# Patient Record
Sex: Female | Born: 1975 | Race: Black or African American | Hispanic: No | Marital: Single | Smoking: Never smoker
Health system: Southern US, Community
[De-identification: ages and names within clinical notes are randomized; demographics above are authoritative.]

## PROBLEM LIST (undated history)

## (undated) DIAGNOSIS — L309 Dermatitis, unspecified: Secondary | ICD-10-CM

## (undated) DIAGNOSIS — M940 Chondrocostal junction syndrome [Tietze]: Secondary | ICD-10-CM

## (undated) DIAGNOSIS — J45909 Unspecified asthma, uncomplicated: Secondary | ICD-10-CM

## (undated) DIAGNOSIS — H209 Unspecified iridocyclitis: Secondary | ICD-10-CM

## (undated) DIAGNOSIS — K219 Gastro-esophageal reflux disease without esophagitis: Secondary | ICD-10-CM

## (undated) HISTORY — PX: OTHER SURGICAL HISTORY: SHX169

## (undated) HISTORY — PX: HERNIA REPAIR: SHX51

## (undated) HISTORY — DX: Unspecified iridocyclitis: H20.9

## (undated) HISTORY — DX: Unspecified asthma, uncomplicated: J45.909

## (undated) HISTORY — PX: WISDOM TOOTH EXTRACTION: SHX21

---

## 2017-04-05 ENCOUNTER — Other Ambulatory Visit: Payer: Self-pay | Admitting: Orthopedic Surgery

## 2017-04-05 DIAGNOSIS — M940 Chondrocostal junction syndrome [Tietze]: Secondary | ICD-10-CM

## 2017-04-09 ENCOUNTER — Other Ambulatory Visit: Payer: BLUE CROSS/BLUE SHIELD

## 2017-04-16 ENCOUNTER — Ambulatory Visit: Payer: Self-pay | Admitting: Cardiology

## 2017-05-02 ENCOUNTER — Ambulatory Visit
Admission: RE | Admit: 2017-05-02 | Discharge: 2017-05-02 | Disposition: A | Discharge status: Home / Self Care | Payer: BLUE CROSS/BLUE SHIELD | Source: Ambulatory Visit | Attending: Orthopedic Surgery | Admitting: Orthopedic Surgery

## 2017-05-02 DIAGNOSIS — M940 Chondrocostal junction syndrome [Tietze]: Secondary | ICD-10-CM

## 2017-05-06 NOTE — Progress Notes (Signed)
Referring-Ronald Gioffre MD Reason for referral-Chest pain  HPI: 42 yo female for evaluation of chest pain at request of Latanya Maudlin MD. Chest CT 05/02/17 showed no calcified atherosclerosis and no pericardial effusion; overall normal study.  Patient has had intermittent chest pain for 20 years.  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  Her pain will last 2 days and increase with certain movements and palpation.  In the past there has been some improvement with nonsteroidals and more recently steroids.  When she is not having these episodes she denies dyspnea, exertional chest pain, palpitations or syncope.  Cardiology now asked to evaluate.  Current Outpatient Medications  Medication Sig Dispense Refill  . Ascorbic Acid (VITAMIN C) 100 MG tablet Take 100 mg by mouth daily.    Marland Kitchen BIOTIN 5000 PO Take 1 tablet by mouth daily.    . cholecalciferol (VITAMIN D) 1000 units tablet Take 1,000 Units by mouth daily.    . diclofenac (VOLTAREN) 75 MG EC tablet Take 75 mg by mouth as directed.    . fluticasone (FLONASE) 50 MCG/ACT nasal spray Place 1 spray into both nostrils daily.    . Fluticasone-Salmeterol (ADVAIR DISKUS) 250-50 MCG/DOSE AEPB Inhale 1 puff into the lungs 2 (two) times daily.    . folic acid (FOLVITE) 1 MG tablet Take 1 mg by mouth daily.  2  . hydrocortisone 2.5 % cream Apply 1 application topically as directed.    Marland Kitchen ibuprofen (ADVIL,MOTRIN) 800 MG tablet Take 800 mg by mouth every 8 (eight) hours as needed.    . Inositol-Folic Acid (PREGNITUDE PO) Take 1 tablet by mouth daily.    . Levocetirizine Dihydrochloride (XYZAL PO) Take 1 tablet by mouth daily.    . meloxicam (MOBIC) 15 MG tablet Take 15 mg by mouth daily.    . montelukast (SINGULAIR) 10 MG tablet Take 10 mg by mouth at bedtime.    . Prenatal Vit-Fe Fumarate-FA (MULTIVITAMIN-PRENATAL) 27-0.8 MG TABS tablet Take 1 tablet by mouth daily at 12 noon.    . SUPER B COMPLEX/C PO Take 1 tablet by mouth  daily.    Marland Kitchen tiZANidine (ZANAFLEX) 4 MG tablet Take 4 mg by mouth as directed.     No current facility-administered medications for this visit.     Allergies  Allergen Reactions  . Augmentin [Amoxicillin-Pot Clavulanate]      Past Medical History:  Diagnosis Date  . Asthma   . Iritis     Past Surgical History:  Procedure Laterality Date  . HERNIA REPAIR      Social History   Socioeconomic History  . Marital status: Single    Spouse name: Not on file  . Number of children: 0  . Years of education: Not on file  . Highest education level: Not on file  Social Needs  . Financial resource strain: Not on file  . Food insecurity - worry: Not on file  . Food insecurity - inability: Not on file  . Transportation needs - medical: Not on file  . Transportation needs - non-medical: Not on file  Occupational History    Comment: Chief Operating Officer and directors  Tobacco Use  . Smoking status: Never Smoker  . Smokeless tobacco: Never Used  Substance and Sexual Activity  . Alcohol use: Yes    Comment: Occasional  . Drug use: Not on file  . Sexual activity: Not on file  Other Topics Concern  . Not on file  Social History  Narrative  . Not on file    Family History  Problem Relation Age of Onset  . Stroke Maternal Grandfather     ROS: no fevers or chills, productive cough, hemoptysis, dysphasia, odynophagia, melena, hematochezia, dysuria, hematuria, rash, seizure activity, orthopnea, PND, pedal edema, claudication. Remaining systems are negative.  Physical Exam:   Blood pressure 120/88, pulse 88, height 5' 6.5" (1.689 m), weight 268 lb (121.6 kg).  General:  Well developed/obese in NAD Skin warm/dry Patient not depressed No peripheral clubbing Back-normal HEENT-normal/normal eyelids Neck supple/normal carotid upstroke bilaterally; no bruits; no JVD; no thyromegaly chest - CTA/ normal expansion CV - RRR/normal S1 and S2; no murmurs, rubs or  gallops;  PMI nondisplaced Abdomen -NT/ND, no HSM, no mass, + bowel sounds, no bruit 2+ femoral pulses, no bruits Ext-no edema, chords, 2+ DP Neuro-grossly nonfocal  ECG -sinus rhythm at a rate of 88.  No ST changes.  Personally reviewed  A/P  1 chest pain-patient's chest pain is consistent with musculoskeletal pain.  Her electrocardiogram shows no ST changes.  Chest CT shows no coronary calcification.  We will not pursue further cardiac evaluation.  Agree with treating chest pain with nonsteroidals.  Kirk Ruths, MD

## 2017-05-08 ENCOUNTER — Telehealth: Payer: Self-pay

## 2017-05-08 NOTE — Telephone Encounter (Signed)
FAXED NOTES TO [facility] 

## 2017-05-11 ENCOUNTER — Ambulatory Visit: Payer: BLUE CROSS/BLUE SHIELD | Admitting: Cardiology

## 2017-05-11 ENCOUNTER — Encounter: Payer: Self-pay | Admitting: Cardiology

## 2017-05-11 VITALS — BP 120/88 | HR 88 | Ht 66.5 in | Wt 268.0 lb

## 2017-05-11 DIAGNOSIS — R072 Precordial pain: Secondary | ICD-10-CM

## 2017-05-11 NOTE — Addendum Note (Signed)
Addended by: Zebedee Iba on: 05/11/2017 11:23 AM   Modules accepted: Orders

## 2017-05-11 NOTE — Patient Instructions (Signed)
Medication Instructions:  Your physician recommends that you continue on your current medications as directed. Please refer to the Current Medication list given to you today.   Labwork: none  Testing/Procedures: none  Follow-Up: Follow up with Dr. Stanford Breed as needed.   Any Other Special Instructions Will Be Listed Below (If Applicable).     If you need a refill on your cardiac medications before your next appointment, please call your pharmacy.

## 2018-11-11 IMAGING — CT CT CHEST W/O CM
2 of 4 series · 14 of 36 positions shown, 17 images · non-contrast
Comparison: None.

CLINICAL DATA: 41-year-old female with costochondral region
symptoms for several years.

EXAM:
CT CHEST WITHOUT CONTRAST
TECHNIQUE: Multidetector CT imaging of the chest was performed following the
standard protocol without IV contrast.

[Series 2: chest 2.00 br40 s3 ax · axial · 0.69mm/px · z∈[+1588,+1832]mm · 11 of 146 slices shown, 14 images]
[im 12/146  mediastinal]
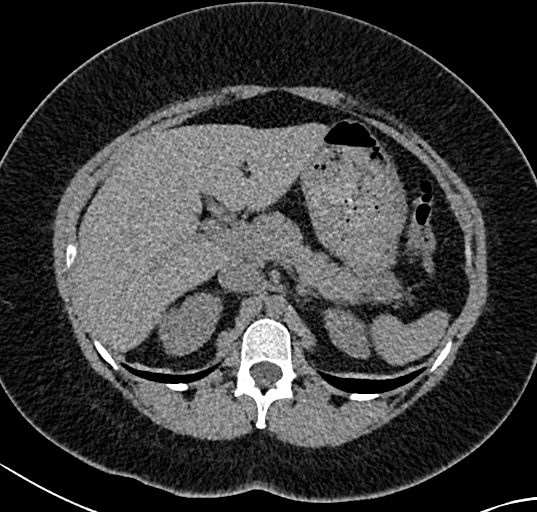
[im 12/146  lung]
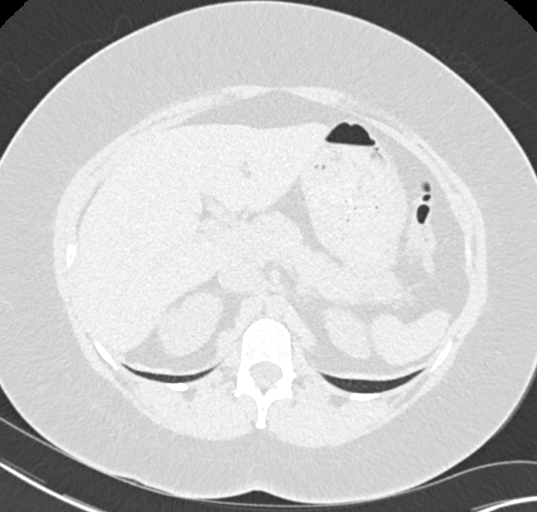
[im 23/146  lung]
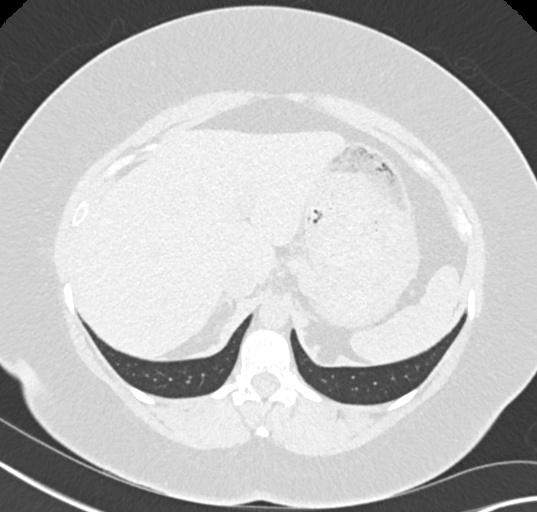
[im 34/146  lung]
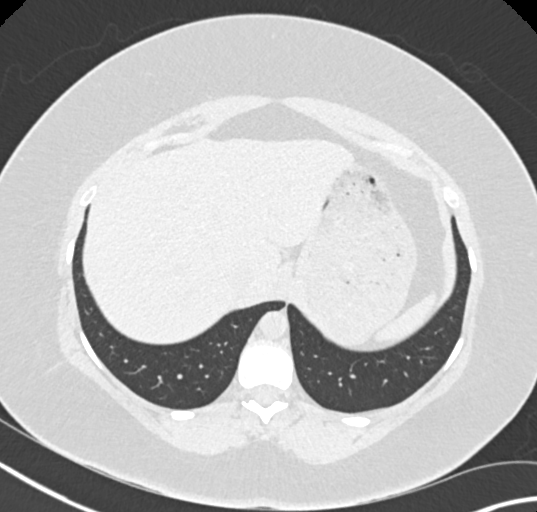
[im 45/146  lung]
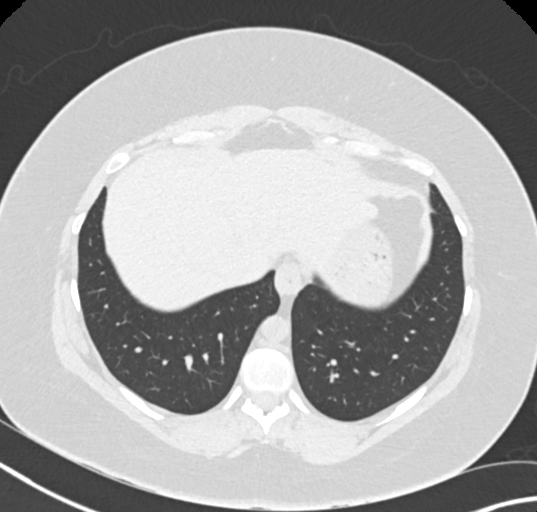
[im 56/146  mediastinal]
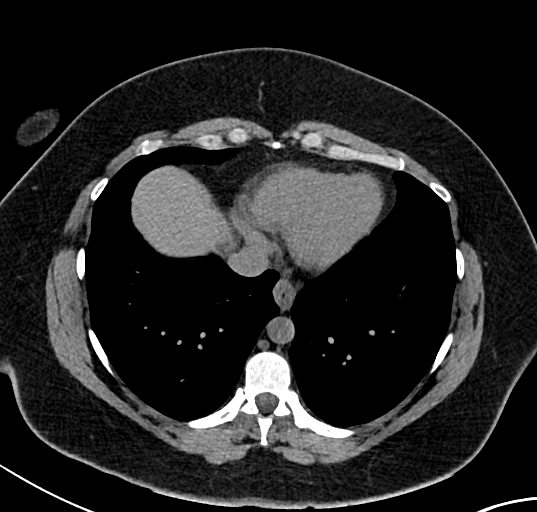
[im 56/146  lung]
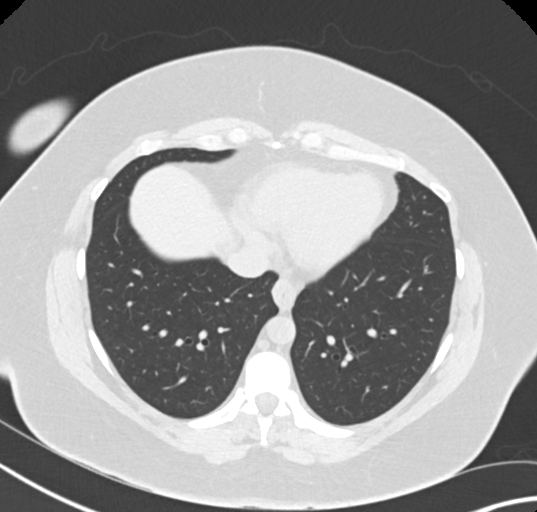
[im 79/146  lung]
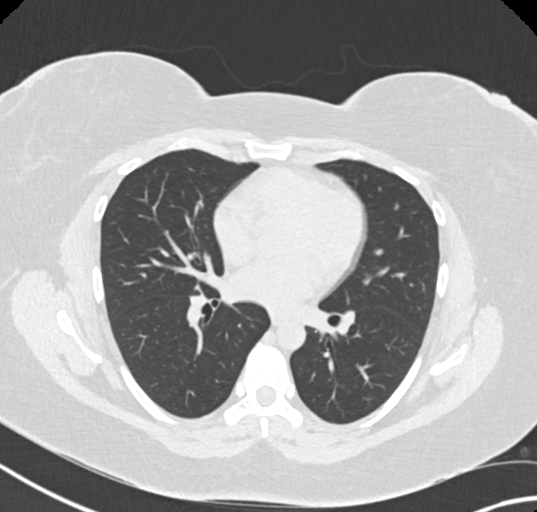
[im 90/146  lung]
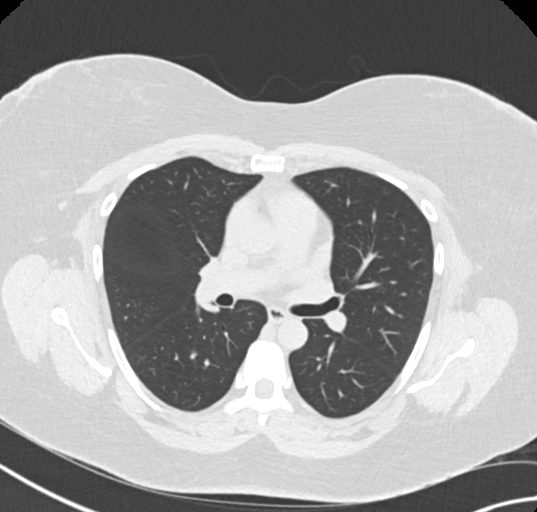
[im 101/146  lung]
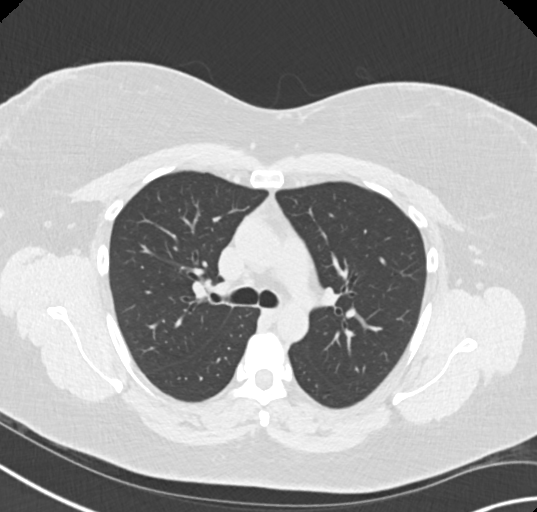
[im 112/146  mediastinal]
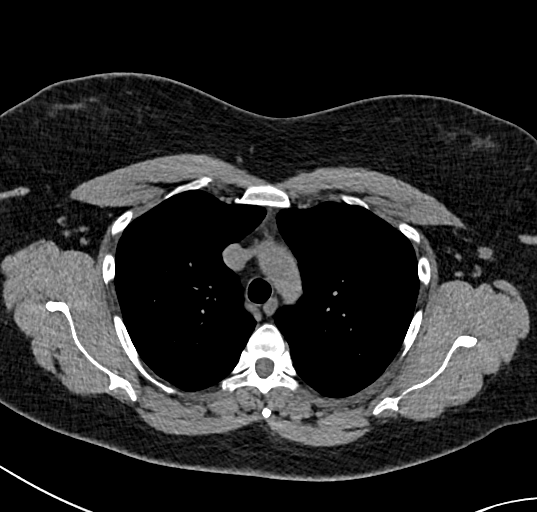
[im 112/146  lung]
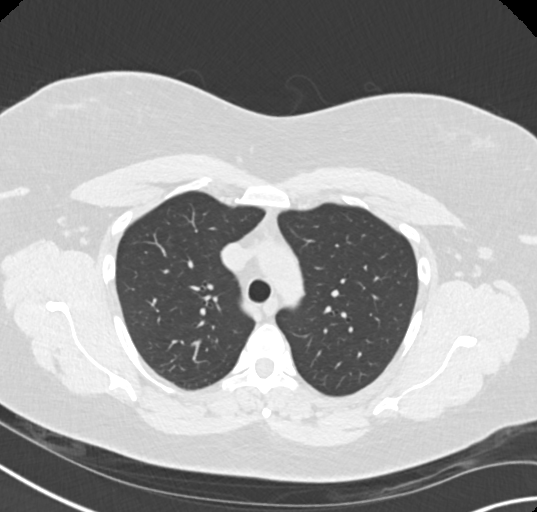
[im 123/146  lung]
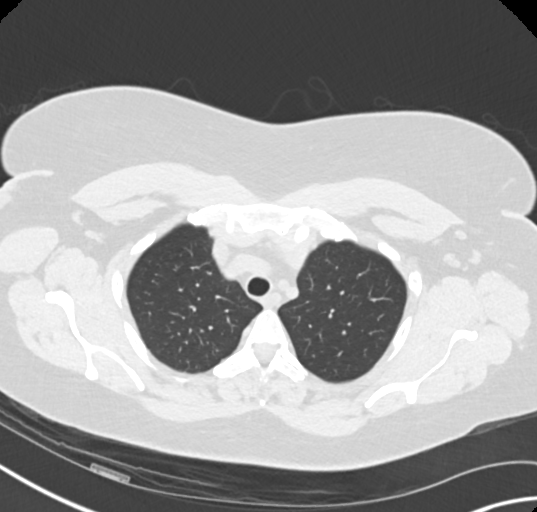
[im 134/146  lung]
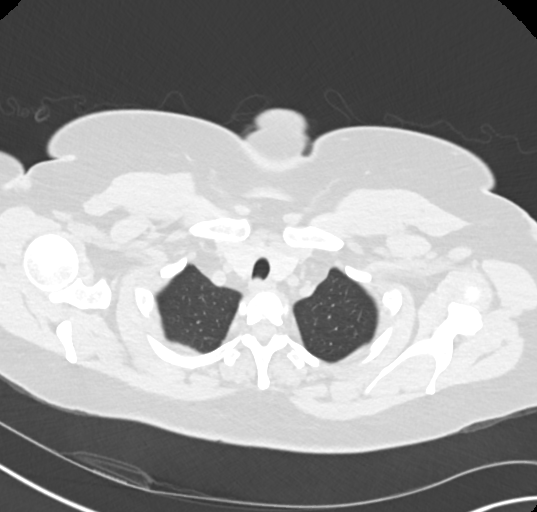

[Series 7: chest 2.00 br40 s3 cor · coronal · 0.57mm/px · 3 of 193 slices shown]
[im 39/193  lung]
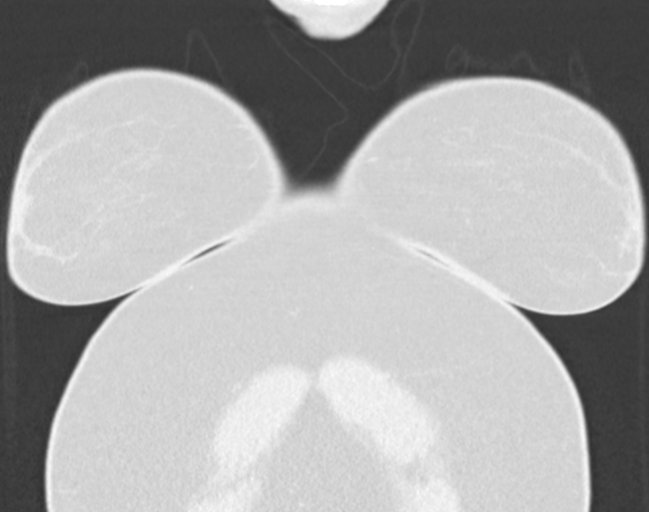
[im 77/193  lung]
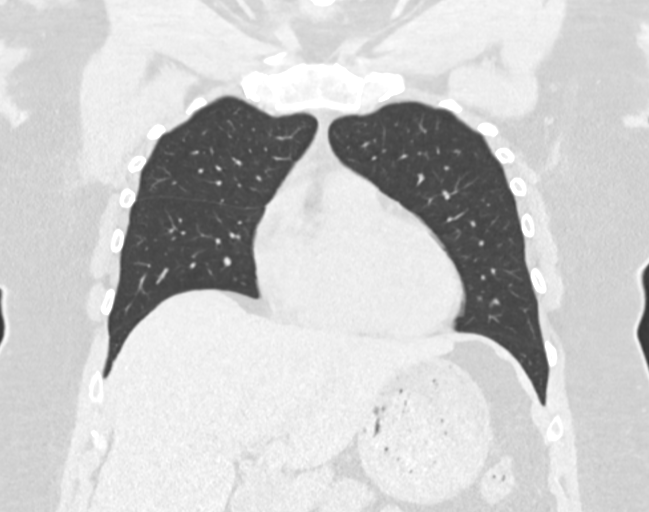
[im 116/193  lung]
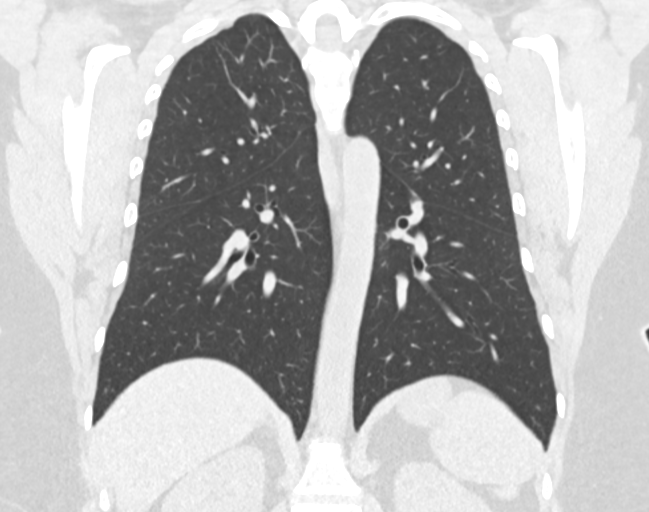

[14 of 36 positions shown; findings below may reference images not displayed]

FINDINGS: Cardiovascular: No calcified atherosclerosis is identified in the
chest. Vascular patency is not evaluated in the absence of IV
contrast. No cardiomegaly or pericardial effusion.

Mediastinum/Nodes: Negative noncontrast thoracic inlet. No
mediastinal or hilar lymphadenopathy.

Lungs/Pleura: Major airways are patent and appear normal. Both lungs
appear clear. No pleural effusion or pleural thickening.

Upper Abdomen: Negative visible noncontrast liver, gallbladder,
spleen, pancreas, adrenal glands, kidneys, and bowel in the upper
abdomen.

Musculoskeletal: Normal thoracic vertebral height and alignment.
Capacious thoracic spinal canal. No rib fracture or anterior rib
lesion identified. The bilateral costochondral structures appear
symmetric and within normal limits (see series 7). The sternum
appears normal. No chest wall abnormality identified. The bilateral
axillary lymph nodes appear normal.
IMPRESSION: Normal noncontrast CT appearance of the chest. No rib or
costochondral abnormality identified.

## 2020-01-23 HISTORY — PX: OTHER SURGICAL HISTORY: SHX169

## 2020-04-13 ENCOUNTER — Other Ambulatory Visit: Payer: Self-pay | Admitting: Obstetrics and Gynecology

## 2020-04-13 DIAGNOSIS — R928 Other abnormal and inconclusive findings on diagnostic imaging of breast: Secondary | ICD-10-CM

## 2020-04-17 ENCOUNTER — Other Ambulatory Visit: Payer: BLUE CROSS/BLUE SHIELD

## 2020-07-06 LAB — OB RESULTS CONSOLE RUBELLA ANTIBODY, IGM: Rubella: IMMUNE

## 2020-07-06 LAB — OB RESULTS CONSOLE ABO/RH: RH Type: POSITIVE

## 2020-07-06 LAB — OB RESULTS CONSOLE HEPATITIS B SURFACE ANTIGEN: Hepatitis B Surface Ag: NEGATIVE

## 2020-07-06 LAB — OB RESULTS CONSOLE RPR: RPR: NONREACTIVE

## 2020-07-06 LAB — OB RESULTS CONSOLE GC/CHLAMYDIA
Chlamydia: NEGATIVE
Gonorrhea: NEGATIVE

## 2020-07-06 LAB — OB RESULTS CONSOLE ANTIBODY SCREEN: Antibody Screen: NEGATIVE

## 2020-07-06 LAB — OB RESULTS CONSOLE HIV ANTIBODY (ROUTINE TESTING): HIV: NONREACTIVE

## 2021-01-26 NOTE — H&P (Signed)
Bonnie Wood is a 45 y.o. female presenting for scheduled primary cesarean section s/s low lying posterior placenta. Last US showed LLP 1.3 cm from cervical os. She was counseled on her options and strongly desired CS. This is an IVF pregnancy with donor sperm and egg. She is expecting a girl. She is AMA and has been on asa until 64 wga. Last US showed EFW in the 93%ile at 32 wga.   OB History   No obstetric history on file.    Past Medical History:  Diagnosis Date   Asthma    Iritis    Past Surgical History:  Procedure Laterality Date   HERNIA REPAIR     Family History: family history includes Stroke in her maternal grandfather. Social History:  reports that she has never smoked. She has never used smokeless tobacco. She reports current alcohol use. No history on file for drug use.     Maternal Diabetes: No Genetic Screening: Normal Maternal Ultrasounds/Referrals: Normal Fetal Ultrasounds or other Referrals:  None Maternal Substance Abuse:  No Significant Maternal Medications:  None Significant Maternal Lab Results:  None Other Comments:  None  Review of Systems History   There were no vitals taken for this visit. Exam Physical Exam  (from office) NAD, A&O NWOB Abd soft, nondistended, gravid  Prenatal labs: ABO, Rh:   Antibody:   Rubella:   RPR:    HBsAg:    HIV:    GBS:   unknown, pending  Assessment/Plan:  77 presenting for scheduled primary CS. Risks discussed including infection, bleeding, damage to surrounding structures, the need for additional procedures including hysterectomy, and the possibility of uterine rupture with neonatal morbidity/mortality, scarring, and abnormal placentation with subsequent pregnancies. Patient agrees to proceed.     Tyson Dense 01/26/2021, 1:59 PM

## 2021-01-31 ENCOUNTER — Other Ambulatory Visit: Payer: Self-pay

## 2021-01-31 ENCOUNTER — Encounter (HOSPITAL_COMMUNITY): Payer: Self-pay | Admitting: Obstetrics and Gynecology

## 2021-01-31 ENCOUNTER — Encounter (HOSPITAL_COMMUNITY)
Admission: RE | Admit: 2021-01-31 | Discharge: 2021-01-31 | Disposition: A | Discharge status: Home / Self Care | Payer: BC Managed Care – PPO | Source: Ambulatory Visit | Attending: Obstetrics and Gynecology | Admitting: Obstetrics and Gynecology

## 2021-01-31 DIAGNOSIS — Z3A39 39 weeks gestation of pregnancy: Secondary | ICD-10-CM

## 2021-01-31 DIAGNOSIS — Z20822 Contact with and (suspected) exposure to covid-19: Secondary | ICD-10-CM | POA: Insufficient documentation

## 2021-01-31 DIAGNOSIS — Z01812 Encounter for preprocedural laboratory examination: Secondary | ICD-10-CM | POA: Insufficient documentation

## 2021-01-31 LAB — CBC
HCT: 35.7 % — ABNORMAL LOW (ref 36.0–46.0)
Hemoglobin: 11.7 g/dL — ABNORMAL LOW (ref 12.0–15.0)
MCH: 30.7 pg (ref 26.0–34.0)
MCHC: 32.8 g/dL (ref 30.0–36.0)
MCV: 93.7 fL (ref 80.0–100.0)
Platelets: 242 10*3/uL (ref 150–400)
RBC: 3.81 MIL/uL — ABNORMAL LOW (ref 3.87–5.11)
RDW: 14.1 % (ref 11.5–15.5)
WBC: 6.6 10*3/uL (ref 4.0–10.5)
nRBC: 0 % (ref 0.0–0.2)

## 2021-01-31 LAB — TYPE AND SCREEN
ABO/RH(D): O POS
Antibody Screen: NEGATIVE

## 2021-01-31 LAB — RPR: RPR Ser Ql: NONREACTIVE

## 2021-01-31 LAB — RESP PANEL BY RT-PCR (FLU A&B, COVID) ARPGX2
Influenza A by PCR: NEGATIVE
Influenza B by PCR: NEGATIVE
SARS Coronavirus 2 by RT PCR: NEGATIVE

## 2021-01-31 LAB — OB RESULTS CONSOLE GBS: GBS: POSITIVE

## 2021-01-31 NOTE — Patient Instructions (Signed)
Bonnie Wood  01/31/2021   Your procedure is scheduled on:  02/02/21  Arrive at [place] at [employer] [facility] on [address] at [facility] and [employer] [employer] [address]. You are invited to use the FREE valet parking or use the  Visitor's parking deck.  Pick up the phone at the desk and dial [phone].  Call this number if you have problems the morning of surgery: [phone]   Remember:   Do not eat food:(After Midnight) Desps de medianoche.  Do not drink clear liquids: (After Midnight) Desps de medianoche.  Take these medicines the morning of surgery with A SIP OF WATER:  Take meds as needed   Do not wear jewelry, make-up or nail polish.  Do not wear lotions, powders, or perfumes. Do not wear deodorant.  Do not shave 48 hours prior to surgery.  Do not bring valuables to the hospital.  [facility] is not   responsible for any belongings or valuables brought to the hospital.  Contacts, dentures or bridgework may not be worn into surgery.  Leave suitcase in the car. After surgery it may be brought to your room.  For patients admitted to the hospital, checkout time is 11:00 AM the day of              discharge.      Please read over the following fact sheets that you were given: Preparing for Surgery

## 2021-01-31 NOTE — Anesthesia Preprocedure Evaluation (Addendum)
Anesthesia Evaluation  Patient identified by MRN, date of birth, ID band Patient awake    Reviewed: Allergy & Precautions, NPO status , Patient's Chart, lab work & pertinent test results  Airway Mallampati: II  TM Distance: >3 FB Neck ROM: Full    Dental no notable dental hx. (+) Teeth Intact, Dental Advisory Given   Pulmonary asthma ,    Pulmonary exam normal breath sounds clear to auscultation       Cardiovascular Exercise Tolerance: Good negative cardio ROS Normal cardiovascular exam Rhythm:Regular Rate:Normal     Neuro/Psych negative neurological ROS     GI/Hepatic negative GI ROS,   Endo/Other  negative endocrine ROS  Renal/GU negative Renal ROS     Musculoskeletal negative musculoskeletal ROS (+)   Abdominal (+) + obese (BMI 40.46),   Peds  Hematology Lab Results      Component                Value               Date                      WBC                      6.6                 01/31/2021                HGB                      11.7 (L)            01/31/2021                HCT                      35.7 (L)            01/31/2021                MCV                      93.7                01/31/2021                PLT                      242                 01/31/2021           Lab Results      Component                Value               Date                      WBC                      6.6                 01/31/2021                HGB  11.7 (L)            01/31/2021                HCT                      35.7 (L)            01/31/2021                MCV                      93.7                01/31/2021                PLT                      242                 01/31/2021              Anesthesia Other Findings All : Augmentin  Reproductive/Obstetrics (+) Pregnancy                            Anesthesia Physical Anesthesia Plan  ASA: 3  Anesthesia  Plan: Spinal   Post-op Pain Management: Regional block and Minimal or no pain anticipated   Induction:   PONV Risk Score and Plan: Treatment may vary due to age or medical condition and Ondansetron  Airway Management Planned: Nasal Cannula and Natural Airway  Additional Equipment: None  Intra-op Plan:   Post-operative Plan:   Informed Consent: I have reviewed the patients History and Physical, chart, labs and discussed the procedure including the risks, benefits and alternatives for the proposed anesthesia with the patient or authorized representative who has indicated his/her understanding and acceptance.     Dental advisory given  Plan Discussed with: CRNA  Anesthesia Plan Comments: (37.4 primagravida for c/s under spinal)       Anesthesia Quick Evaluation

## 2021-02-02 ENCOUNTER — Inpatient Hospital Stay (HOSPITAL_COMMUNITY)
Admission: RE | Admit: 2021-02-02 | Discharge: 2021-02-05 | DRG: 788 | Disposition: A | Discharge status: Home / Self Care | Payer: BC Managed Care – PPO | Attending: Obstetrics and Gynecology | Admitting: Obstetrics and Gynecology

## 2021-02-02 ENCOUNTER — Inpatient Hospital Stay (HOSPITAL_COMMUNITY): Payer: BC Managed Care – PPO | Admitting: Anesthesiology

## 2021-02-02 ENCOUNTER — Encounter (HOSPITAL_COMMUNITY): Payer: Self-pay | Admitting: Obstetrics and Gynecology

## 2021-02-02 ENCOUNTER — Other Ambulatory Visit: Payer: Self-pay

## 2021-02-02 ENCOUNTER — Encounter (HOSPITAL_COMMUNITY)
Admission: RE | Disposition: A | Discharge status: Home / Self Care | Payer: Self-pay | Source: Home / Self Care | Attending: Obstetrics and Gynecology

## 2021-02-02 DIAGNOSIS — O4443 Low lying placenta NOS or without hemorrhage, third trimester: Secondary | ICD-10-CM | POA: Diagnosis present

## 2021-02-02 DIAGNOSIS — O3413 Maternal care for benign tumor of corpus uteri, third trimester: Secondary | ICD-10-CM | POA: Diagnosis present

## 2021-02-02 DIAGNOSIS — Z20822 Contact with and (suspected) exposure to covid-19: Secondary | ICD-10-CM | POA: Diagnosis present

## 2021-02-02 DIAGNOSIS — Z3490 Encounter for supervision of normal pregnancy, unspecified, unspecified trimester: Secondary | ICD-10-CM

## 2021-02-02 DIAGNOSIS — D259 Leiomyoma of uterus, unspecified: Secondary | ICD-10-CM | POA: Diagnosis present

## 2021-02-02 DIAGNOSIS — Z3A37 37 weeks gestation of pregnancy: Secondary | ICD-10-CM

## 2021-02-02 HISTORY — DX: Chondrocostal junction syndrome (tietze): M94.0

## 2021-02-02 HISTORY — DX: Gastro-esophageal reflux disease without esophagitis: K21.9

## 2021-02-02 HISTORY — DX: Dermatitis, unspecified: L30.9

## 2021-02-02 SURGERY — Surgical Case
Anesthesia: Spinal

## 2021-02-02 MED ORDER — CLINDAMYCIN PHOSPHATE 900 MG/50ML IV SOLN
900.0000 mg | INTRAVENOUS | Status: AC
  Administered 2021-02-02: 900 mg via INTRAVENOUS

## 2021-02-02 MED ORDER — ONDANSETRON HCL 4 MG/2ML IJ SOLN
INTRAMUSCULAR | Status: AC
  Filled 2021-02-02: qty 2

## 2021-02-02 MED ORDER — WITCH HAZEL-GLYCERIN EX PADS: 1.0000 "application " | MEDICATED_PAD | CUTANEOUS | Status: DC | PRN

## 2021-02-02 MED ORDER — ONDANSETRON HCL 4 MG/2ML IJ SOLN
INTRAMUSCULAR | Status: DC | PRN
  Administered 2021-02-02: 4 mg via INTRAVENOUS

## 2021-02-02 MED ORDER — STERILE WATER FOR IRRIGATION IR SOLN
Status: DC | PRN
  Administered 2021-02-02: 1

## 2021-02-02 MED ORDER — KETOROLAC TROMETHAMINE 30 MG/ML IJ SOLN
30.0000 mg | Freq: Once | INTRAMUSCULAR | Status: AC | PRN
  Administered 2021-02-02: 30 mg via INTRAVENOUS

## 2021-02-02 MED ORDER — IBUPROFEN 600 MG PO TABS
600.0000 mg | ORAL_TABLET | Freq: Four times a day (QID) | ORAL | Status: DC
  Administered 2021-02-03 – 2021-02-05 (×9): 600 mg via ORAL
  Filled 2021-02-02 (×9): qty 1

## 2021-02-02 MED ORDER — MORPHINE SULFATE (PF) 0.5 MG/ML IJ SOLN
INTRAMUSCULAR | Status: DC | PRN
  Administered 2021-02-02: 150 ug via INTRATHECAL

## 2021-02-02 MED ORDER — BUPIVACAINE IN DEXTROSE 0.75-8.25 % IT SOLN
INTRATHECAL | Status: DC | PRN
  Administered 2021-02-02: 12 mg via INTRATHECAL

## 2021-02-02 MED ORDER — ONDANSETRON HCL 4 MG/2ML IJ SOLN: 4.0000 mg | Freq: Once | INTRAMUSCULAR | Status: DC | PRN

## 2021-02-02 MED ORDER — DIPHENHYDRAMINE HCL 25 MG PO CAPS
25.0000 mg | ORAL_CAPSULE | Freq: Four times a day (QID) | ORAL | Status: DC | PRN
  Administered 2021-02-03 – 2021-02-05 (×2): 25 mg via ORAL
  Filled 2021-02-02 (×2): qty 1

## 2021-02-02 MED ORDER — KETOROLAC TROMETHAMINE 30 MG/ML IJ SOLN
30.0000 mg | Freq: Four times a day (QID) | INTRAMUSCULAR | Status: AC
  Administered 2021-02-02 – 2021-02-03 (×3): 30 mg via INTRAVENOUS
  Filled 2021-02-02 (×3): qty 1

## 2021-02-02 MED ORDER — LORATADINE 10 MG PO TABS
10.0000 mg | ORAL_TABLET | Freq: Every day | ORAL | Status: DC
  Administered 2021-02-02 – 2021-02-05 (×4): 10 mg via ORAL
  Filled 2021-02-02 (×4): qty 1

## 2021-02-02 MED ORDER — LACTATED RINGERS IV SOLN
INTRAVENOUS | Status: DC | PRN
Start: 2021-02-02 — End: 2021-02-02

## 2021-02-02 MED ORDER — SIMETHICONE 80 MG PO CHEW
80.0000 mg | CHEWABLE_TABLET | Freq: Three times a day (TID) | ORAL | Status: DC
  Administered 2021-02-02 – 2021-02-05 (×7): 80 mg via ORAL
  Filled 2021-02-02 (×8): qty 1

## 2021-02-02 MED ORDER — ACETAMINOPHEN 10 MG/ML IV SOLN
INTRAVENOUS | Status: AC
  Filled 2021-02-02: qty 100

## 2021-02-02 MED ORDER — TETANUS-DIPHTH-ACELL PERTUSSIS 5-2.5-18.5 LF-MCG/0.5 IM SUSY: 0.5000 mL | PREFILLED_SYRINGE | Freq: Once | INTRAMUSCULAR | Status: DC

## 2021-02-02 MED ORDER — SIMETHICONE 80 MG PO CHEW: 80.0000 mg | CHEWABLE_TABLET | ORAL | Status: DC | PRN

## 2021-02-02 MED ORDER — OXYCODONE HCL 5 MG PO TABS
5.0000 mg | ORAL_TABLET | ORAL | Status: DC | PRN
  Administered 2021-02-02: 5 mg via ORAL
  Administered 2021-02-03 – 2021-02-04 (×2): 10 mg via ORAL
  Administered 2021-02-04 – 2021-02-05 (×2): 5 mg via ORAL
  Administered 2021-02-05: 10 mg via ORAL
  Filled 2021-02-02 (×2): qty 2
  Filled 2021-02-02: qty 1
  Filled 2021-02-02: qty 2
  Filled 2021-02-02 (×3): qty 1

## 2021-02-02 MED ORDER — METOCLOPRAMIDE HCL 5 MG/ML IJ SOLN
INTRAMUSCULAR | Status: DC | PRN
Start: 2021-02-02 — End: 2021-02-02
  Administered 2021-02-02: 10 mg via INTRAVENOUS

## 2021-02-02 MED ORDER — DEXAMETHASONE SODIUM PHOSPHATE 4 MG/ML IJ SOLN
INTRAMUSCULAR | Status: AC
  Filled 2021-02-02: qty 2

## 2021-02-02 MED ORDER — LEVOCETIRIZINE DIHYDROCHLORIDE 5 MG PO TABS: 5.0000 mg | ORAL_TABLET | ORAL | Status: DC

## 2021-02-02 MED ORDER — OXYCODONE HCL 5 MG PO TABS: 5.0000 mg | ORAL_TABLET | Freq: Once | ORAL | Status: DC | PRN

## 2021-02-02 MED ORDER — KETOROLAC TROMETHAMINE 30 MG/ML IJ SOLN
INTRAMUSCULAR | Status: AC
  Filled 2021-02-02: qty 1

## 2021-02-02 MED ORDER — GENTAMICIN SULFATE 40 MG/ML IJ SOLN
5.0000 mg/kg | INTRAVENOUS | Status: AC
  Administered 2021-02-02: 410 mg via INTRAVENOUS
  Filled 2021-02-02: qty 10.25

## 2021-02-02 MED ORDER — COCONUT OIL OIL: 1.0000 "application " | TOPICAL_OIL | Status: DC | PRN

## 2021-02-02 MED ORDER — SOD CITRATE-CITRIC ACID 500-334 MG/5ML PO SOLN
ORAL | Status: AC
  Filled 2021-02-02: qty 30

## 2021-02-02 MED ORDER — HYDROMORPHONE HCL 1 MG/ML IJ SOLN: 0.2500 mg | INTRAMUSCULAR | Status: DC | PRN

## 2021-02-02 MED ORDER — PHENYLEPHRINE HCL-NACL 20-0.9 MG/250ML-% IV SOLN
INTRAVENOUS | Status: DC | PRN
  Administered 2021-02-02: 60 ug/min via INTRAVENOUS

## 2021-02-02 MED ORDER — CLINDAMYCIN PHOSPHATE 900 MG/50ML IV SOLN
INTRAVENOUS | Status: AC
  Filled 2021-02-02: qty 50

## 2021-02-02 MED ORDER — ACETAMINOPHEN 10 MG/ML IV SOLN
INTRAVENOUS | Status: DC | PRN
  Administered 2021-02-02: 1000 mg via INTRAVENOUS

## 2021-02-02 MED ORDER — MENTHOL 3 MG MT LOZG: 1.0000 | LOZENGE | OROMUCOSAL | Status: DC | PRN

## 2021-02-02 MED ORDER — HYDROMORPHONE HCL 1 MG/ML IJ SOLN: 0.2000 mg | INTRAMUSCULAR | Status: DC | PRN

## 2021-02-02 MED ORDER — SOD CITRATE-CITRIC ACID 500-334 MG/5ML PO SOLN
30.0000 mL | ORAL | Status: AC
  Administered 2021-02-02: 30 mL via ORAL

## 2021-02-02 MED ORDER — SENNOSIDES-DOCUSATE SODIUM 8.6-50 MG PO TABS
2.0000 | ORAL_TABLET | Freq: Every day | ORAL | Status: DC
  Administered 2021-02-03 – 2021-02-05 (×3): 2 via ORAL
  Filled 2021-02-02 (×3): qty 2

## 2021-02-02 MED ORDER — OXYTOCIN-SODIUM CHLORIDE 30-0.9 UT/500ML-% IV SOLN
INTRAVENOUS | Status: AC
  Filled 2021-02-02: qty 500

## 2021-02-02 MED ORDER — ZOLPIDEM TARTRATE 5 MG PO TABS: 5.0000 mg | ORAL_TABLET | Freq: Every evening | ORAL | Status: DC | PRN

## 2021-02-02 MED ORDER — OXYTOCIN-SODIUM CHLORIDE 30-0.9 UT/500ML-% IV SOLN
INTRAVENOUS | Status: DC | PRN
  Administered 2021-02-02: 475 mL via INTRAVENOUS

## 2021-02-02 MED ORDER — PRENATAL MULTIVITAMIN CH
1.0000 | ORAL_TABLET | Freq: Every day | ORAL | Status: DC
  Administered 2021-02-03 – 2021-02-05 (×3): 1 via ORAL
  Filled 2021-02-02 (×4): qty 1

## 2021-02-02 MED ORDER — FENTANYL CITRATE (PF) 100 MCG/2ML IJ SOLN
INTRAMUSCULAR | Status: DC | PRN
  Administered 2021-02-02: 15 ug via INTRATHECAL

## 2021-02-02 MED ORDER — METOCLOPRAMIDE HCL 5 MG/ML IJ SOLN
INTRAMUSCULAR | Status: AC
  Filled 2021-02-02: qty 2

## 2021-02-02 MED ORDER — FENTANYL CITRATE (PF) 100 MCG/2ML IJ SOLN
INTRAMUSCULAR | Status: AC
  Filled 2021-02-02: qty 2

## 2021-02-02 MED ORDER — OXYTOCIN-SODIUM CHLORIDE 30-0.9 UT/500ML-% IV SOLN: 2.5000 [IU]/h | INTRAVENOUS | Status: AC

## 2021-02-02 MED ORDER — SCOPOLAMINE 1 MG/3DAYS TD PT72
MEDICATED_PATCH | TRANSDERMAL | Status: AC
  Filled 2021-02-02: qty 1

## 2021-02-02 MED ORDER — PHENYLEPHRINE 40 MCG/ML (10ML) SYRINGE FOR IV PUSH (FOR BLOOD PRESSURE SUPPORT)
PREFILLED_SYRINGE | INTRAVENOUS | Status: AC
  Filled 2021-02-02: qty 10

## 2021-02-02 MED ORDER — DIBUCAINE (PERIANAL) 1 % EX OINT: 1.0000 "application " | TOPICAL_OINTMENT | CUTANEOUS | Status: DC | PRN

## 2021-02-02 MED ORDER — DEXAMETHASONE SODIUM PHOSPHATE 4 MG/ML IJ SOLN
INTRAMUSCULAR | Status: DC | PRN
  Administered 2021-02-02: 8 mg via INTRAVENOUS

## 2021-02-02 MED ORDER — LACTATED RINGERS IV SOLN: INTRAVENOUS | Status: DC

## 2021-02-02 MED ORDER — ACETAMINOPHEN 500 MG PO TABS
1000.0000 mg | ORAL_TABLET | Freq: Four times a day (QID) | ORAL | Status: DC
  Administered 2021-02-02 – 2021-02-05 (×12): 1000 mg via ORAL
  Filled 2021-02-02 (×12): qty 2

## 2021-02-02 MED ORDER — MORPHINE SULFATE (PF) 0.5 MG/ML IJ SOLN
INTRAMUSCULAR | Status: AC
  Filled 2021-02-02: qty 10

## 2021-02-02 MED ORDER — PHENYLEPHRINE HCL-NACL 20-0.9 MG/250ML-% IV SOLN
INTRAVENOUS | Status: AC
  Filled 2021-02-02: qty 250

## 2021-02-02 MED ORDER — OXYCODONE HCL 5 MG/5ML PO SOLN: 5.0000 mg | Freq: Once | ORAL | Status: DC | PRN

## 2021-02-02 MED ORDER — SCOPOLAMINE 1 MG/3DAYS TD PT72
MEDICATED_PATCH | TRANSDERMAL | Status: DC | PRN
  Administered 2021-02-02: 1 via TRANSDERMAL

## 2021-02-02 SURGICAL SUPPLY — 39 items
BARRIER ADHS 3X4 INTERCEED (GAUZE/BANDAGES/DRESSINGS) ×2 IMPLANT
BENZOIN TINCTURE PRP APPL 2/3 (GAUZE/BANDAGES/DRESSINGS) ×2 IMPLANT
CHLORAPREP W/TINT 26ML (MISCELLANEOUS) ×2 IMPLANT
CLAMP CORD UMBIL (MISCELLANEOUS) IMPLANT
CLOTH BEACON ORANGE TIMEOUT ST (SAFETY) ×2 IMPLANT
CLSR STERI-STRIP ANTIMIC 1/2X4 (GAUZE/BANDAGES/DRESSINGS) ×2 IMPLANT
DRSG OPSITE POSTOP 4X10 (GAUZE/BANDAGES/DRESSINGS) ×2 IMPLANT
ELECT REM PT RETURN 9FT ADLT (ELECTROSURGICAL) ×2
ELECTRODE REM PT RTRN 9FT ADLT (ELECTROSURGICAL) ×1 IMPLANT
EXTRACTOR VACUUM KIWI (MISCELLANEOUS) IMPLANT
GLOVE BIO SURGEON STRL SZ 6.5 (GLOVE) ×2 IMPLANT
GLOVE BIOGEL PI IND STRL 6.5 (GLOVE) ×1 IMPLANT
GLOVE BIOGEL PI IND STRL 7.0 (GLOVE) ×2 IMPLANT
GLOVE BIOGEL PI INDICATOR 6.5 (GLOVE) ×1
GLOVE BIOGEL PI INDICATOR 7.0 (GLOVE) ×2
GOWN STRL REUS W/TWL LRG LVL3 (GOWN DISPOSABLE) ×4 IMPLANT
HEMOSTAT ARISTA ABSORB 3G PWDR (HEMOSTASIS) ×2 IMPLANT
HOVERMATT SINGLE USE (MISCELLANEOUS) ×2 IMPLANT
KIT ABG SYR 3ML LUER SLIP (SYRINGE) ×2 IMPLANT
NEEDLE HYPO 25X5/8 SAFETYGLIDE (NEEDLE) ×2 IMPLANT
NS IRRIG 1000ML POUR BTL (IV SOLUTION) ×2 IMPLANT
PACK C SECTION WH (CUSTOM PROCEDURE TRAY) ×2 IMPLANT
PAD ABD DERMACEA PRESS 5X9 (GAUZE/BANDAGES/DRESSINGS) ×2 IMPLANT
PAD OB MATERNITY 4.3X12.25 (PERSONAL CARE ITEMS) ×2 IMPLANT
PENCIL SMOKE EVAC W/HOLSTER (ELECTROSURGICAL) ×2 IMPLANT
SPONGE GAUZE 4X4 12PLY STER LF (GAUZE/BANDAGES/DRESSINGS) ×4 IMPLANT
SPONGE SURGIFOAM ABS GEL 12-7 (HEMOSTASIS) ×2 IMPLANT
STRIP CLOSURE SKIN 1/4X4 (GAUZE/BANDAGES/DRESSINGS) ×2 IMPLANT
SUT MON AB 4-0 PS1 27 (SUTURE) ×2 IMPLANT
SUT PLAIN 0 NONE (SUTURE) IMPLANT
SUT PLAIN 2 0 (SUTURE) ×1
SUT PLAIN ABS 2-0 CT1 27XMFL (SUTURE) ×1 IMPLANT
SUT VIC AB 0 CT1 36 (SUTURE) ×2 IMPLANT
SUT VIC AB 0 CTX 36 (SUTURE) ×2
SUT VIC AB 0 CTX36XBRD ANBCTRL (SUTURE) ×2 IMPLANT
SUT VIC AB 4-0 PS2 27 (SUTURE) ×2 IMPLANT
TOWEL OR 17X24 6PK STRL BLUE (TOWEL DISPOSABLE) ×2 IMPLANT
TRAY FOLEY W/BAG SLVR 14FR LF (SET/KITS/TRAYS/PACK) IMPLANT
WATER STERILE IRR 1000ML POUR (IV SOLUTION) ×2 IMPLANT

## 2021-02-02 NOTE — Progress Notes (Signed)
No updates to above H&P. Patient arrived NPO and was consented in PACU. Risks again discussed, all questions answered, and consent signed. Proceed with above surgery.    Aziya Arena MD  

## 2021-02-02 NOTE — Transfer of Care (Signed)
Immediate Anesthesia Transfer of Care Note  Patient: Bonnie Wood  Procedure(s) Performed: PRIMARY CESAREAN SECTION EDC: 02-19-21 ALLERG: AUGMENTIN, TREE NUT, FISH DERIVED  Patient Location: PACU  Anesthesia Type:Spinal  Level of Consciousness: awake, alert  and oriented  Airway & Oxygen Therapy: Patient Spontanous Breathing  Post-op Assessment: Report given to RN and Post -op Vital signs reviewed and stable  Post vital signs: Reviewed and stable  Last Vitals:  Vitals Value Taken Time  BP 115/99 02/02/21 0858  Temp    Pulse 84 02/02/21 0900  Resp 26 02/02/21 0900  SpO2 100 % 02/02/21 0900  Vitals shown include unvalidated device data.  Last Pain:  Vitals:   02/02/21 0556  TempSrc: Oral  PainSc: 0-No pain         Complications: No notable events documented.

## 2021-02-02 NOTE — Op Note (Signed)
PROCEDURE DATE: 11/30.22   PREOPERATIVE DIAGNOSIS: low lying placenta   POSTOPERATIVE DIAGNOSIS: The same and adhesive disease   PROCEDURE:    Primary Low Transverse Cesarean Section, LOA   SURGEON:  Dr. Lucillie Garfinkel   INDICATIONS: This is a 45yo G2P0 at 37.4 wga requiring cesarean section secondary to low lying placenta.  Decision made to proceed with LTCS. The risks of cesarean section discussed with the patient included but were not limited to: bleeding which may require transfusion or reoperation; infection which may require antibiotics; injury to bowel, bladder, ureters or other surrounding organs; injury to the fetus; need for additional procedures including hysterectomy in the event of a life-threatening hemorrhage; placental abnormalities wth subsequent pregnancies, incisional problems, thromboembolic phenomenon and other postoperative/anesthesia complications. The patient agreed with the proposed plan, giving informed consent for the procedure.     FINDINGS:  Viable female infant in vertex presentation, APGARs pending,  Weight pending, Amniotic fluid clear,  Intact placenta, three vessel cord.  Uterus with several small fibroids but a plethora of filmy adhesions all over anterior uterus, fundus, and posteriorly .   ANESTHESIA:    Epidural ESTIMATED BLOOD LOSS: 300cc SPECIMENS: Placenta for routine COMPLICATIONS: None immediate   PROCEDURE IN DETAIL:  The patient received intravenous antibiotics (2g Ancef) and had sequential compression devices applied to her lower extremities while in the preoperative area.  She was then taken to the operating room where epidural anesthesia was dosed up to surgical level and was found to be adequate. She was then placed in a dorsal supine position with a leftward tilt, and prepped and draped in a sterile manner.  A foley catheter was placed into her bladder and attached to constant gravity.  After an adequate timeout was performed, a Pfannenstiel skin  incision was made with scalpel and carried through to the underlying layer of fascia. The fascia was incised in the midline and this incision was extended bilaterally using the Mayo scissors. Kocher clamps were applied to the superior aspect of the fascial incision and the underlying rectus muscles were dissected off bluntly. A similar process was carried out on the inferior aspect of the facial incision. The rectus muscles were separated in the midline bluntly and the peritoneum was entered bluntly.  A bladder flap was created sharply and developed bluntly. A transverse hysterotomy was made with a scalpel and extended bilaterally bluntly. The bladder blade was then removed. The infant was successfully delivered, and cord was clamped and cut and infant was handed over to awaiting neonatology team. Uterine massage was then administered and the placenta delivered intact with three-vessel cord. Cord gases were taken. The uterus was cleared of clot and debris. Dense and filmy adhesive disease noted as above which was generally oozing throughout. Hemostasis was achieved with cautery and pressure. The hysterotomy was closed with 0 vicryl.  A second imbricating suture of 0-vicryl was used to reinforce the incision and aid in hemostasis. Arista placed.The fascia was closed with 0-Vicryl in a running fashion with good restoration of anatomy.  The subcutaneus tissue was irrigated and was reapproximated using three interrupted plain gut stitches.  The skin was closed with 4-0 Vicryl in a subcuticular fashion.  All surgical site and was hemostatic at end of procedure) without any further bleeding on exam.    Pt tolerated the procedure well. All sponge/lap/needle counts were correct  X 2. Pt taken to recovery room in stable condition.     Lucillie Garfinkel MD

## 2021-02-02 NOTE — Lactation Note (Signed)
This note was copied from a baby's chart. Lactation Consultation Note  Patient Name: Bonnie Wood [id number]'[id number] Date: 02/02/2021 Reason for consult: Initial assessment;Mother's request;Primapara;1st time breastfeeding;Early term 37-38.6wks;Breastfeeding assistance Age:44 hours  LC assisted with latching with signs of milk transfer. Mom denied any pain with the latch.  Mom feeding plan EBM. Mom returning to work at 54 weeks. Mom Spectra pump at home.   Mom wants a manual pump to go over parts and usage in the am.   Plan 1. To feed based on cues 8-12x 24 hr period. Mom to offer breasts and look for signs of milk transfer. 2. Mom to offer EBM via spoon 5-7 ml if unable to latch.  3. I and O sheet reviewed.  All questions answered at the end of the visit.   Maternal Data Has patient been taught Hand Expression?: Yes Does the patient have breastfeeding experience prior to this delivery?: No  Feeding Mother's Current Feeding Choice: Breast Milk  LATCH Score Latch: Repeated attempts needed to sustain latch, nipple held in mouth throughout feeding, stimulation needed to elicit sucking reflex.  Audible Swallowing: Spontaneous and intermittent  Type of Nipple: Everted at rest and after stimulation  Comfort (Breast/Nipple): Soft / non-tender  Hold (Positioning): Assistance needed to correctly position infant at breast and maintain latch.  LATCH Score: 8   Lactation Tools Discussed/Used    Interventions Interventions: Breast feeding basics reviewed;Adjust position;Assisted with latch;Support pillows;Skin to skin;Position options;Education;Breast massage;Expressed milk;Hand express;LC Services brochure;Breast compression  Discharge Pump: Personal  Consult Status Consult Status: Follow-up Date: 02/03/21 Follow-up type: In-patient    Bonnie Purdy  Wood 02/02/2021, 5:51 PM

## 2021-02-02 NOTE — Anesthesia Procedure Notes (Signed)
Spinal  Patient location during procedure: OB Start time: 02/02/2021 7:27 AM End time: 02/02/2021 7:33 AM Reason for block: surgical anesthesia Staffing Performed: anesthesiologist  Anesthesiologist: Barnet Glasgow, MD Preanesthetic Checklist Completed: patient identified, IV checked, risks and benefits discussed, surgical consent, monitors and equipment checked, pre-op evaluation and timeout performed Spinal Block Patient position: sitting Prep: DuraPrep and site prepped and draped Patient monitoring: heart rate, cardiac monitor, continuous pulse ox and blood pressure Approach: midline Location: L3-4 Injection technique: single-shot Needle Needle type: Pencan  Needle gauge: 24 G Needle length: 10 cm Needle insertion depth: 7 cm Assessment Sensory level: T4 Events: CSF return Additional Notes  1 Attempt (s). Pt tolerated procedure well.

## 2021-02-02 NOTE — Anesthesia Postprocedure Evaluation (Signed)
Anesthesia Post Note  Patient: Bonnie Wood  Procedure(s) Performed: PRIMARY CESAREAN SECTION EDC: 02-19-21 ALLERG: AUGMENTIN, TREE NUT, FISH DERIVED     Patient location during evaluation: Mother Baby Anesthesia Type: Spinal Level of consciousness: oriented and awake and alert Pain management: pain level controlled Vital Signs Assessment: post-procedure vital signs reviewed and stable Respiratory status: spontaneous breathing and respiratory function stable Cardiovascular status: blood pressure returned to baseline and stable Postop Assessment: no headache, no backache, no apparent nausea or vomiting and able to ambulate Anesthetic complications: no   No notable events documented.  Last Vitals:  Vitals:   02/02/21 1000 02/02/21 1015  BP: 113/74 124/72  Pulse:  63  Resp: 13 17  Temp: 36.7 C 36.7 C  SpO2:  100%    Last Pain:  Vitals:   02/02/21 1015  TempSrc: Oral  PainSc:    Pain Goal:                   Barnet Glasgow

## 2021-02-03 LAB — CBC
HCT: 28.8 % — ABNORMAL LOW (ref 36.0–46.0)
Hemoglobin: 9.6 g/dL — ABNORMAL LOW (ref 12.0–15.0)
MCH: 31.2 pg (ref 26.0–34.0)
MCHC: 33.3 g/dL (ref 30.0–36.0)
MCV: 93.5 fL (ref 80.0–100.0)
Platelets: 192 10*3/uL (ref 150–400)
RBC: 3.08 MIL/uL — ABNORMAL LOW (ref 3.87–5.11)
RDW: 14 % (ref 11.5–15.5)
WBC: 12 10*3/uL — ABNORMAL HIGH (ref 4.0–10.5)
nRBC: 0 % (ref 0.0–0.2)

## 2021-02-03 NOTE — Progress Notes (Signed)
Subjective: Postpartum Day 1: Cesarean Delivery Patient reports tolerating PO and no problems voiding.    Objective: Vital signs in last 24 hours: Temp:  [98 F (36.7 C)-98.3 F (36.8 C)] 98.2 F (36.8 C) (12/01 0436) Pulse Rate:  [54-86] 62 (12/01 0436) Resp:  [7-18] 18 (12/01 0436) BP: (104-155)/(56-82) 111/64 (12/01 0436) SpO2:  [96 %-100 %] 100 % (12/01 0436)  Physical Exam:  General: alert, cooperative, appears stated age, and no distress Lochia: appropriate Uterine Fundus: firm Incision: bandage dry DVT Evaluation: No evidence of DVT seen on physical exam.  Recent Labs    02/03/21 0654  HGB 9.6*  HCT 28.8*    Assessment/Plan: Status post Cesarean section. Doing well postoperatively.  Continue current care.  Luz Lex 02/03/2021, 9:33 AM

## 2021-02-04 NOTE — Lactation Note (Signed)
This note was copied from a baby's chart. Lactation Consultation Note  Patient Name: Bonnie Wood [id number] Date: 02/04/2021 Reason for consult: Follow-up assessment;Mother's request;Primapara;1st time breastfeeding;Early term 37-38.6wks;Infant weight loss;Breastfeeding assistance Age:44 hours Mom asked for latch assistance last latch pinchy. Mom to use EBM for nipple care.   LC assisted with football latch with signs of milk transfer. Mom denied any pain with the latch.  Infant adequate urine and stool output.  Mom to compress and breast to offer more volume.   All questions answered at the end of the visit.  Maternal Data Has patient been taught Hand Expression?: Yes  Feeding Mother's Current Feeding Choice: Breast Milk  LATCH Score Latch: Grasps breast easily, tongue down, lips flanged, rhythmical sucking.  Audible Swallowing: Spontaneous and intermittent  Type of Nipple: Everted at rest and after stimulation  Comfort (Breast/Nipple): Filling, red/small blisters or bruises, mild/mod discomfort  Hold (Positioning): Assistance needed to correctly position infant at breast and maintain latch.  LATCH Score: 8   Lactation Tools Discussed/Used    Interventions Interventions: Breast feeding basics reviewed;Adjust position;Assisted with latch;Support pillows;Skin to skin;Position options;Education;Breast massage;Expressed milk;Hand express;Infant Driven Feeding Algorithm education;Breast compression  Discharge    Consult Status Consult Status: Follow-up Date: 02/05/21 Follow-up type: In-patient    Bonnie Ehmke  Wood 02/04/2021, 11:01 PM

## 2021-02-04 NOTE — Lactation Note (Addendum)
This note was copied from a baby's chart. Lactation Consultation Note  Patient Name: Girl Siri Buege QIWLN'L Date: 02/04/2021 Reason for consult: Mother's request;Follow-up assessment (Mom requested LC services tonight and confirmed by RN) Age:44 hours LC entered the room, mom had infant latched on her right breast using the football hold position but infant was asleep on the breast. Per mom, infant recently breastfeed for 40 minutes, mom feels infant is latching well. LC discussed with mom do not let infant sleep at the breast, to offer breast stimulation to keep infant awake while breastfeeding such as breast compressions, gently stroking infant 's neck and shoulder. LC reviewed hand expression with mom using breast module and mom self expressed 1 mls of colostrum, mom knows she can hand express and give infant back her EBM after latching infant at the breast. Mom knows to latch infant on both breast during a feeding. Mom knows to call RN/LC if she has further breastfeeding questions, concerns or needs assistance with latching infant at the breast.  Maternal Data Has patient been taught Hand Expression?: Yes Does the patient have breastfeeding experience prior to this delivery?: No  Feeding Mother's Current Feeding Choice: Breast Milk  LATCH Score              Lactation Tools Discussed/Used    Interventions Interventions: Skin to skin;Breast compression;Position options;Expressed milk;Education  Discharge Pump: Personal [place] Program: No  Consult Status Consult Status: Follow-up Date: 02/04/21 Follow-up type: In-patient    Vicente Serene 02/04/2021, 2:59 AM

## 2021-02-04 NOTE — Progress Notes (Signed)
Subjective: Postpartum Day 2: Cesarean Delivery Patient reports tolerating PO, + flatus, and no problems voiding.    Objective: Vital signs in last 24 hours: Temp:  [97.9 F (36.6 C)-98.3 F (36.8 C)] 97.9 F (36.6 C) (12/02 0513) Pulse Rate:  [73-83] 73 (12/02 0513) Resp:  [17-18] 18 (12/02 0513) BP: (107-128)/(62-79) 124/79 (12/02 0513) SpO2:  [99 %-100 %] 99 % (12/02 0513)  Physical Exam:  General: alert, cooperative, and appears stated age Lochia: appropriate Uterine Fundus: firm Incision: healing well, no significant drainage, no dehiscence DVT Evaluation: No evidence of DVT seen on physical exam. Negative Homan's sign. No cords or calf tenderness.  Recent Labs    02/03/21 0654  HGB 9.6*  HCT 28.8*    Assessment/Plan: Status post Cesarean section. Doing well postoperatively.  Continue current care.  Linda Hedges 02/04/2021, 11:00 AM

## 2021-02-05 MED ORDER — IBUPROFEN 600 MG PO TABS: 600.0000 mg | ORAL_TABLET | Freq: Four times a day (QID) | ORAL | 0 refills | Status: AC

## 2021-02-05 MED ORDER — OXYCODONE HCL 5 MG PO TABS: 5.0000 mg | ORAL_TABLET | ORAL | 0 refills | Status: AC | PRN

## 2021-02-05 NOTE — Discharge Summary (Signed)
Postpartum Discharge Summary  Patient Name: Bonnie Wood DOB: 04/24/1975 MRN: [id number]  Date of admission: 02/02/2021 Delivery date:02/02/2021  Delivering provider: Tyson Dense  Date of discharge: 02/05/2021  Admitting diagnosis: Pregnancy [Z34.90] Intrauterine pregnancy: [redacted]w[redacted]d    Secondary diagnosis:  Principal Problem:   Pregnancy  Additional problems: Low lying placenta    Discharge diagnosis: Term Pregnancy Delivered                                              Post partum procedures: none Augmentation: N/A Complications: None  Hospital course: Sceduled C/S   45y.o. yo G2P1011 at 392w4das admitted to the hospital 02/02/2021 for scheduled cesarean section with the following indication: low lying placenta .Delivery details are as follows:  Membrane Rupture Time/Date: 8:01 AM ,02/02/2021   Delivery Method:C-Section, Low Transverse  Details of operation can be found in separate operative note.  Patient had an uncomplicated postpartum course.  She is ambulating, tolerating a regular diet, passing flatus, and urinating well. Patient is discharged home in stable condition on  02/05/21        Newborn Data: Birth date:02/02/2021  Birth time:8:02 AM  Gender:Female  Living status:Living  Apgars:8 ,9  Weight:2870 g     Magnesium Sulfate received: No BMZ received: No Rhophylac:No MMR:No T-DaP:Given prenatally Flu: No Transfusion:No  Physical exam  Vitals:   02/04/21 0513 02/04/21 1442 02/04/21 2137 02/05/21 0534  BP: 124/79 132/77 124/69 119/75  Pulse: 73 83 91 82  Resp: _0 Temp: 97.9 F (36.6 C) 98.4 F (36.9 C) 98.4 F (36.9 C) 97.9 F (36.6 C)  TempSrc: Oral Oral Oral Oral  SpO2: 99% 100% 100% 96%  Weight:      Height:       General: alert, cooperative, and no distress Lochia: appropriate Uterine Fundus: firm Incision: Healing well with no significant drainage, No significant erythema, Dressing is clean, dry, and intact DVT  Evaluation: No evidence of DVT seen on physical exam. Negative Homan's sign. No cords or calf tenderness. Labs: Lab Results  Component Value Date   WBC 12.0 (H) 02/03/2021   HGB 9.6 (L) 02/03/2021   HCT 28.8 (L) 02/03/2021   MCV 93.5 02/03/2021   PLT 192 02/03/2021   No flowsheet data found. Edinburgh Score: Edinburgh Postnatal Depression Scale Screening Tool 02/02/2021  I have been able to laugh and see the funny side of things. 0  I have looked forward with enjoyment to things. 0  I have blamed myself unnecessarily when things went wrong. 0  I have been anxious or worried for no good reason. 0  I have felt scared or panicky for no good reason. 0  Things have been getting on top of me. 1  I have been so unhappy that I have had difficulty sleeping. 0  I have felt sad or miserable. 0  I have been so unhappy that I have been crying. 0  The thought of harming myself has occurred to me. 0  Edinburgh Postnatal Depression Scale Total 1     After visit meds:  Allergies as of 02/05/2021       Reactions   Other Anaphylaxis   Tree nuts Fresh water fish   **chest tightness, throat closes   Augmentin [amoxicillin-pot Clavulanate]    Yeast infection, itchy all over  Medication List     STOP taking these medications    aspirin EC 81 MG tablet   docusate sodium 100 MG capsule Commonly known as: COLACE   famotidine 20 MG tablet Commonly known as: PEPCID   pantoprazole 40 MG tablet Commonly known as: PROTONIX       TAKE these medications    acetaminophen 500 MG tablet Commonly known as: TYLENOL Take 1,000 mg by mouth every 8 (eight) hours as needed for moderate pain.   budesonide 180 MCG/ACT inhaler Commonly known as: PULMICORT Inhale 1 puff into the lungs in the morning and at bedtime.   fluticasone 50 MCG/ACT nasal spray Commonly known as: FLONASE Place 1 spray into both nostrils daily.   ibuprofen 600 MG tablet Commonly known as: ADVIL Take 1  tablet (600 mg total) by mouth every 6 (six) hours.   levocetirizine 5 MG tablet Commonly known as: XYZAL Take 5 mg by mouth every morning.   multivitamin-prenatal 27-0.8 MG Tabs tablet Take 1 tablet by mouth daily.   oxyCODONE 5 MG immediate release tablet Commonly known as: Oxy IR/ROXICODONE Take 1 tablet (5 mg total) by mouth every 4 (four) hours as needed for moderate pain.         Discharge home in stable condition Infant Feeding: Breast Infant Disposition:home with mother Discharge instruction: per After Visit Summary and Postpartum booklet. Activity: Advance as tolerated. Pelvic rest for 6 weeks.  Diet: routine diet Future Appointments:No future appointments. Follow up Visit: 6 weeks PPV  02/05/2021 Megan Morris, DO    

## 2021-02-05 NOTE — Progress Notes (Signed)
Subjective: Postpartum Day 3: Cesarean Delivery Patient reports tolerating PO, + flatus, + BM, and no problems voiding.    Objective: Vital signs in last 24 hours: Temp:  [97.9 F (36.6 C)-98.4 F (36.9 C)] 97.9 F (36.6 C) (12/03 0534) Pulse Rate:  [82-91] 82 (12/03 0534) Resp:  [16-18] 18 (12/03 0534) BP: (119-132)/(69-77) 119/75 (12/03 0534) SpO2:  [96 %-100 %] 96 % (12/03 0534)  Physical Exam:  General: alert, cooperative, and appears stated age 45: appropriate Uterine Fundus: firm Incision: healing well, no significant drainage, no dehiscence DVT Evaluation: No evidence of DVT seen on physical exam. Negative Homan's sign. No cords or calf tenderness.  Recent Labs    02/03/21 0654  HGB 9.6*  HCT 28.8*    Assessment/Plan: Status post Cesarean section. Doing well postoperatively.  Discharge home with standard precautions and return to clinic in 4-6 weeks.  Linda Hedges 02/05/2021, 9:06 AM

## 2021-02-05 NOTE — Lactation Note (Signed)
This note was copied from a baby's chart. Lactation Consultation Note  Patient Name: Bonnie Wood INOMV'E Date: 02/05/2021 Reason for consult: Follow-up assessment;Early term 37-38.6wks;Primapara;1st time breastfeeding Age:44 hours   P1 mother whose infant is now 63 hours old.  This is an ETI at 37+4 weeks.    Previous LC asked me to follow up with this mother.  When I arrived, baby had just finished breast feeding.  Mother had no questions/concerns at this time and stated that baby fed well.  Mother denied pain with feeding.  Mother requested a swaddling demonstration.  Swaddled baby and placed her in the bassinet.  Mother appreciative.  Grandmother present and supportive.  Mother has a DEBP for home use.   Maternal Data    Feeding Mother's Current Feeding Choice: Breast Milk  LATCH Score Latch: Grasps breast easily, tongue down, lips flanged, rhythmical sucking.  Audible Swallowing: A few with stimulation  Type of Nipple: Everted at rest and after stimulation  Comfort (Breast/Nipple): Soft / non-tender  Hold (Positioning): Assistance needed to correctly position infant at breast and maintain latch.  LATCH Score: 8   Lactation Tools Discussed/Used    Interventions Interventions: Education  Discharge Discharge Education: Engorgement and breast care Pump: Personal  Consult Status Consult Status: Complete Date: 02/05/21 Follow-up type: Call as needed    Tinaya Ceballos R Delman Goshorn 02/05/2021, 3:45 AM

## 2021-02-05 NOTE — Discharge Instructions (Signed)
Call MD for T>100.4, heavy vaginal bleeding, severe abdominal pain, or respiratory distress.  Call office to schedule postpartum visit in 6 weeks.  Pelvic rest x 6 weeks.  No driving while taking narcotics.  Pelvic rest x 6 weeks.

## 2021-02-15 ENCOUNTER — Telehealth (HOSPITAL_COMMUNITY): Payer: Self-pay | Admitting: *Deleted

## 2021-02-15 NOTE — Telephone Encounter (Signed)
Patient voiced no questions or concerns at this time. EPDS=1. Patient reported that infant is not back up to birth weight. Stated that pediatrician wants her to supplement, but patient does not want to give infant a bottle. Patient is trying to breastfeed more frequently to increase infant's intake. Reported that infant has follow-up appointment with pediatrician scheduled for tomorrow. RN offered to email phone numbers for lactation warm-line and for outpatient services. Suggested patient call to schedule an outpatient appointment to discuss her concerns. Patient verbalized understanding. Patient voiced no other questions or concerns regarding infant at this time. Patient reports infant sleeps in a bassinet on her back. RN reviewed ABCs of safe sleep. Patient verbalized understanding. Patient requested RN email information on hospital's virtual postpartum classes and support groups. Email sent. Erline Levine, RN, 02/15/21, Lurline Hare
